# Patient Record
Sex: Female | Born: 1940 | Race: Black or African American | Hispanic: No | Marital: Single | Smoking: Never smoker
Health system: Southern US, Community
[De-identification: ages and names within clinical notes are randomized; demographics above are authoritative.]

## PROBLEM LIST (undated history)

## (undated) DIAGNOSIS — F0390 Unspecified dementia without behavioral disturbance: Secondary | ICD-10-CM

## (undated) DIAGNOSIS — I10 Essential (primary) hypertension: Secondary | ICD-10-CM

---

## 2018-09-02 ENCOUNTER — Emergency Department (HOSPITAL_BASED_OUTPATIENT_CLINIC_OR_DEPARTMENT_OTHER)
Admission: EM | Admit: 2018-09-02 | Discharge: 2018-09-02 | Disposition: A | Discharge status: Home / Self Care | Payer: Medicare Other | Attending: Emergency Medicine | Admitting: Emergency Medicine

## 2018-09-02 ENCOUNTER — Emergency Department (HOSPITAL_BASED_OUTPATIENT_CLINIC_OR_DEPARTMENT_OTHER): Payer: Medicare Other

## 2018-09-02 ENCOUNTER — Other Ambulatory Visit: Payer: Self-pay

## 2018-09-02 ENCOUNTER — Encounter (HOSPITAL_BASED_OUTPATIENT_CLINIC_OR_DEPARTMENT_OTHER): Payer: Self-pay | Admitting: *Deleted

## 2018-09-02 DIAGNOSIS — R002 Palpitations: Secondary | ICD-10-CM

## 2018-09-02 DIAGNOSIS — I10 Essential (primary) hypertension: Secondary | ICD-10-CM | POA: Insufficient documentation

## 2018-09-02 DIAGNOSIS — F0390 Unspecified dementia without behavioral disturbance: Secondary | ICD-10-CM | POA: Insufficient documentation

## 2018-09-02 DIAGNOSIS — Z79899 Other long term (current) drug therapy: Secondary | ICD-10-CM | POA: Insufficient documentation

## 2018-09-02 HISTORY — DX: Essential (primary) hypertension: I10

## 2018-09-02 HISTORY — DX: Unspecified dementia, unspecified severity, without behavioral disturbance, psychotic disturbance, mood disturbance, and anxiety: F03.90

## 2018-09-02 LAB — CBC WITH DIFFERENTIAL/PLATELET
Abs Immature Granulocytes: 0.01 10*3/uL (ref 0.00–0.07)
Basophils Absolute: 0 10*3/uL (ref 0.0–0.1)
Basophils Relative: 0 %
Eosinophils Absolute: 0.3 10*3/uL (ref 0.0–0.5)
Eosinophils Relative: 4 %
HCT: 31.8 % — ABNORMAL LOW (ref 36.0–46.0)
Hemoglobin: 9.5 g/dL — ABNORMAL LOW (ref 12.0–15.0)
Immature Granulocytes: 0 %
Lymphocytes Relative: 33 %
Lymphs Abs: 2.8 10*3/uL (ref 0.7–4.0)
MCH: 29 pg (ref 26.0–34.0)
MCHC: 29.9 g/dL — ABNORMAL LOW (ref 30.0–36.0)
MCV: 97 fL (ref 80.0–100.0)
Monocytes Absolute: 0.7 10*3/uL (ref 0.1–1.0)
Monocytes Relative: 8 %
Neutro Abs: 4.6 10*3/uL (ref 1.7–7.7)
Neutrophils Relative %: 55 %
Platelets: 210 10*3/uL (ref 150–400)
RBC: 3.28 MIL/uL — ABNORMAL LOW (ref 3.87–5.11)
RDW: 13.7 % (ref 11.5–15.5)
WBC: 8.4 10*3/uL (ref 4.0–10.5)
nRBC: 0 % (ref 0.0–0.2)

## 2018-09-02 LAB — URINALYSIS, ROUTINE W REFLEX MICROSCOPIC
Bilirubin Urine: NEGATIVE
Glucose, UA: NEGATIVE mg/dL
Hgb urine dipstick: NEGATIVE
Ketones, ur: NEGATIVE mg/dL
Nitrite: NEGATIVE
Protein, ur: NEGATIVE mg/dL
Specific Gravity, Urine: 1.01 (ref 1.005–1.030)
pH: 6 (ref 5.0–8.0)

## 2018-09-02 LAB — BASIC METABOLIC PANEL
Anion gap: 11 (ref 5–15)
BUN: 52 mg/dL — ABNORMAL HIGH (ref 8–23)
CO2: 23 mmol/L (ref 22–32)
Calcium: 9.4 mg/dL (ref 8.9–10.3)
Chloride: 104 mmol/L (ref 98–111)
Creatinine, Ser: 1.5 mg/dL — ABNORMAL HIGH (ref 0.44–1.00)
GFR calc Af Amer: 38 mL/min — ABNORMAL LOW (ref >60)
GFR calc non Af Amer: 33 mL/min — ABNORMAL LOW (ref >60)
Glucose, Bld: 85 mg/dL (ref 70–99)
Potassium: 4.6 mmol/L (ref 3.5–5.1)
Sodium: 138 mmol/L (ref 135–145)

## 2018-09-02 LAB — URINALYSIS, MICROSCOPIC (REFLEX)

## 2018-09-02 LAB — BRAIN NATRIURETIC PEPTIDE: B Natriuretic Peptide: 128.3 pg/mL — ABNORMAL HIGH (ref 0.0–100.0)

## 2018-09-02 NOTE — ED Triage Notes (Signed)
States she has had palpitations for 6 months. She flew from [place] and is  visiting her family. At night her palpitations are worse when she lays down. Her cardiologist wanted her to have an EKG before flying back to [place] in 3 days.

## 2018-09-02 NOTE — Discharge Instructions (Addendum)
Return here as needed.  Follow-up with your cardiologist as soon as possible.  Testing today was normal and your EKG did not show any abnormalities at this time.

## 2018-09-02 NOTE — ED Provider Notes (Signed)
[facility] EMERGENCY DEPARTMENT Provider Note   CSN: [id number] Arrival date & time: 6/[id number]/20  1257     History   Chief Complaint Chief Complaint  Patient presents with  . Palpitations    HPI Gina CommentDollie Palmer is a 78 y.o. female.     HPI Patient presents to the emergency department with palpitations that been ongoing over the last 6 months.  The patient states that she is visiting family from [place] and states last night she felt like her palpitations were worse.  She states that she spoke with her cardiologist who advised her to come to the emergency department for EKG.  Patient states that nothing seems to make the condition better or worse.  She states that lying down at night seems to make these palpitations worse.  The patient denies chest pain, shortness of breath, headache,blurred vision, neck pain, fever, cough, weakness, numbness, dizziness, anorexia, edema, abdominal pain, nausea, vomiting, diarrhea, rash, back pain, dysuria, hematemesis, bloody stool, near syncope, or syncope. Past Medical History:  Diagnosis Date  . Dementia (HCC)   . Hypertension     There are no active problems to display for this patient.   History reviewed. No pertinent surgical history.   OB History   No obstetric history on file.      Home Medications    Prior to Admission medications   Medication Sig Start Date End Date Taking? Authorizing Provider  ARIPiprazole (ABILIFY) 5 MG tablet Take 5 mg by mouth daily.   Yes [provider]  aspirin 81 MG chewable tablet Chew by mouth daily.   Yes [provider]  atorvastatin (LIPITOR) 80 MG tablet Take 80 mg by mouth daily.   Yes [provider]  furosemide (LASIX) 20 MG tablet Take 20 mg by mouth.   Yes [provider]  furosemide (LASIX) 40 MG tablet Take 40 mg by mouth.   Yes [provider]  isosorbide mononitrate (IMDUR) 60 MG 24 hr tablet Take 60 mg by mouth daily.   Yes [provider]  metoprolol succinate (TOPROL-XL) 50 MG 24 hr tablet Take 50 mg by mouth daily. Take with or immediately following a meal.   Yes [provider]  NIFEdipine (ADALAT CC) 60 MG 24 hr tablet Take 60 mg by mouth daily.   Yes [provider]  sertraline (ZOLOFT) 50 MG tablet Take 50 mg by mouth daily.   Yes [provider]  traZODone (DESYREL) 50 MG tablet Take 50 mg by mouth at bedtime.   Yes [provider]  valsartan (DIOVAN) 320 MG tablet Take 320 mg by mouth daily.   Yes [provider]    Family History No family history on file.  Social History Social History   Tobacco Use  . Smoking status: Never Smoker  . Smokeless tobacco: Never Used  Substance Use Topics  . Alcohol use: Never    Frequency: Never  . Drug use: Never     Allergies   Patient has no known allergies.   Review of Systems Review of Systems All other systems negative except as documented in the HPI. All pertinent positives and negatives as reviewed in the HPI.  Physical Exam Updated Vital Signs BP (!) 151/58 (BP Location: Right Arm)   Pulse 67   Temp 98.3 F (36.8 C) (Oral)   Resp 14   Ht 5\' 1"  (1.549 m)   Wt 76.7 kg   SpO2 98%   BMI 31.93 kg/m   Physical  Exam Vitals signs and nursing note reviewed.  Constitutional:      General: She is not in acute distress.    Appearance: She is well-developed.  HENT:     Head: Normocephalic and atraumatic.  Eyes:     Pupils: Pupils are equal, round, and reactive to light.  Neck:     Musculoskeletal: Normal range of motion and neck supple.  Cardiovascular:     Rate and Rhythm: Normal rate and regular rhythm.     Heart sounds: Normal heart sounds. No murmur. No friction rub. No gallop.   Pulmonary:     Effort: Pulmonary effort is normal. No respiratory distress.     Breath sounds: Normal breath sounds. No wheezing.  Abdominal:     General: Bowel sounds are normal. There is no distension.      Palpations: Abdomen is soft.     Tenderness: There is no abdominal tenderness.  Skin:    General: Skin is warm and dry.     Capillary Refill: Capillary refill takes less than 2 seconds.     Findings: No erythema or rash.  Neurological:     Mental Status: She is alert and oriented to person, place, and time.     Motor: No abnormal muscle tone.     Coordination: Coordination normal.  Psychiatric:        Behavior: Behavior normal.      ED Treatments / Results  Labs (all labs ordered are listed, but only abnormal results are displayed) Labs Reviewed  BASIC METABOLIC PANEL - Abnormal; Notable for the following components:      Result Value   BUN 52 (*)    Creatinine, Ser 1.50 (*)    GFR calc non Af Amer 33 (*)    GFR calc Af Amer 38 (*)    All other components within normal limits  CBC WITH DIFFERENTIAL/PLATELET - Abnormal; Notable for the following components:   RBC 3.28 (*)    Hemoglobin 9.5 (*)    HCT 31.8 (*)    MCHC 29.9 (*)    All other components within normal limits  URINALYSIS, ROUTINE W REFLEX MICROSCOPIC - Abnormal; Notable for the following components:   Leukocytes,Ua TRACE (*)    All other components within normal limits  BRAIN NATRIURETIC PEPTIDE - Abnormal; Notable for the following components:   B Natriuretic Peptide 128.3 (*)    All other components within normal limits  URINALYSIS, MICROSCOPIC (REFLEX) - Abnormal; Notable for the following components:   Bacteria, UA FEW (*)    All other components within normal limits    EKG EKG Interpretation  Date/Time:  Monday September 02 2018 13:12:00 EDT Ventricular Rate:  63 PR Interval:  190 QRS Duration: 82 QT Interval:  396 QTC Calculation: 405 R Axis:   -2 Text Interpretation:  Normal sinus rhythm Normal ECG Confirmed by Jacalyn LefevreHaviland, Julie [phone]([id number]) on 09/02/2018 1:42:20 PM   Radiology Dg Chest 2 View  Result Date: 09/02/2018 CLINICAL DATA:  Shortness of breath and recent travel EXAM: CHEST - 2 VIEW COMPARISON:   None. FINDINGS: Cardiac shadows within normal limits. Aortic calcifications are noted. The lungs are well aerated without focal infiltrate or sizable effusion. No bony abnormality is noted. IMPRESSION: No active cardiopulmonary disease. Electronically Signed   By: Alcide CleverMark  Lukens M.D.   On: 09/02/2018 14:57    Procedures Procedures (including critical care time)  Medications Ordered in ED Medications - No data to display   Initial Impression / Assessment and Plan / ED  Course  I have reviewed the triage vital signs and the nursing notes.  Pertinent labs & imaging results that were available during my care of the patient were reviewed by me and considered in my medical decision making (see chart for details).        The patient will be discharged home.  Her laboratory testing does not yield any significant abnormalities.  EKG does not show any acute changes.  The patient is advised to follow-up with her cardiologist when she returns home.  Told to return here as needed.  Patient agrees the plan and all questions were answered.  Final Clinical Impressions(s) / ED Diagnoses   Final diagnoses:  None    ED Discharge Orders    None       Dalia Heading, PA-C 09/02/18 [place]    Isla Pence, MD 09/04/18 1431

## 2019-11-16 IMAGING — DX CHEST - 2 VIEW
2 series · 2 of 2 positions shown · non-contrast
Comparison: None.

CLINICAL DATA: Shortness of breath and recent travel

EXAM:
CHEST - 2 VIEW

[chest pa]
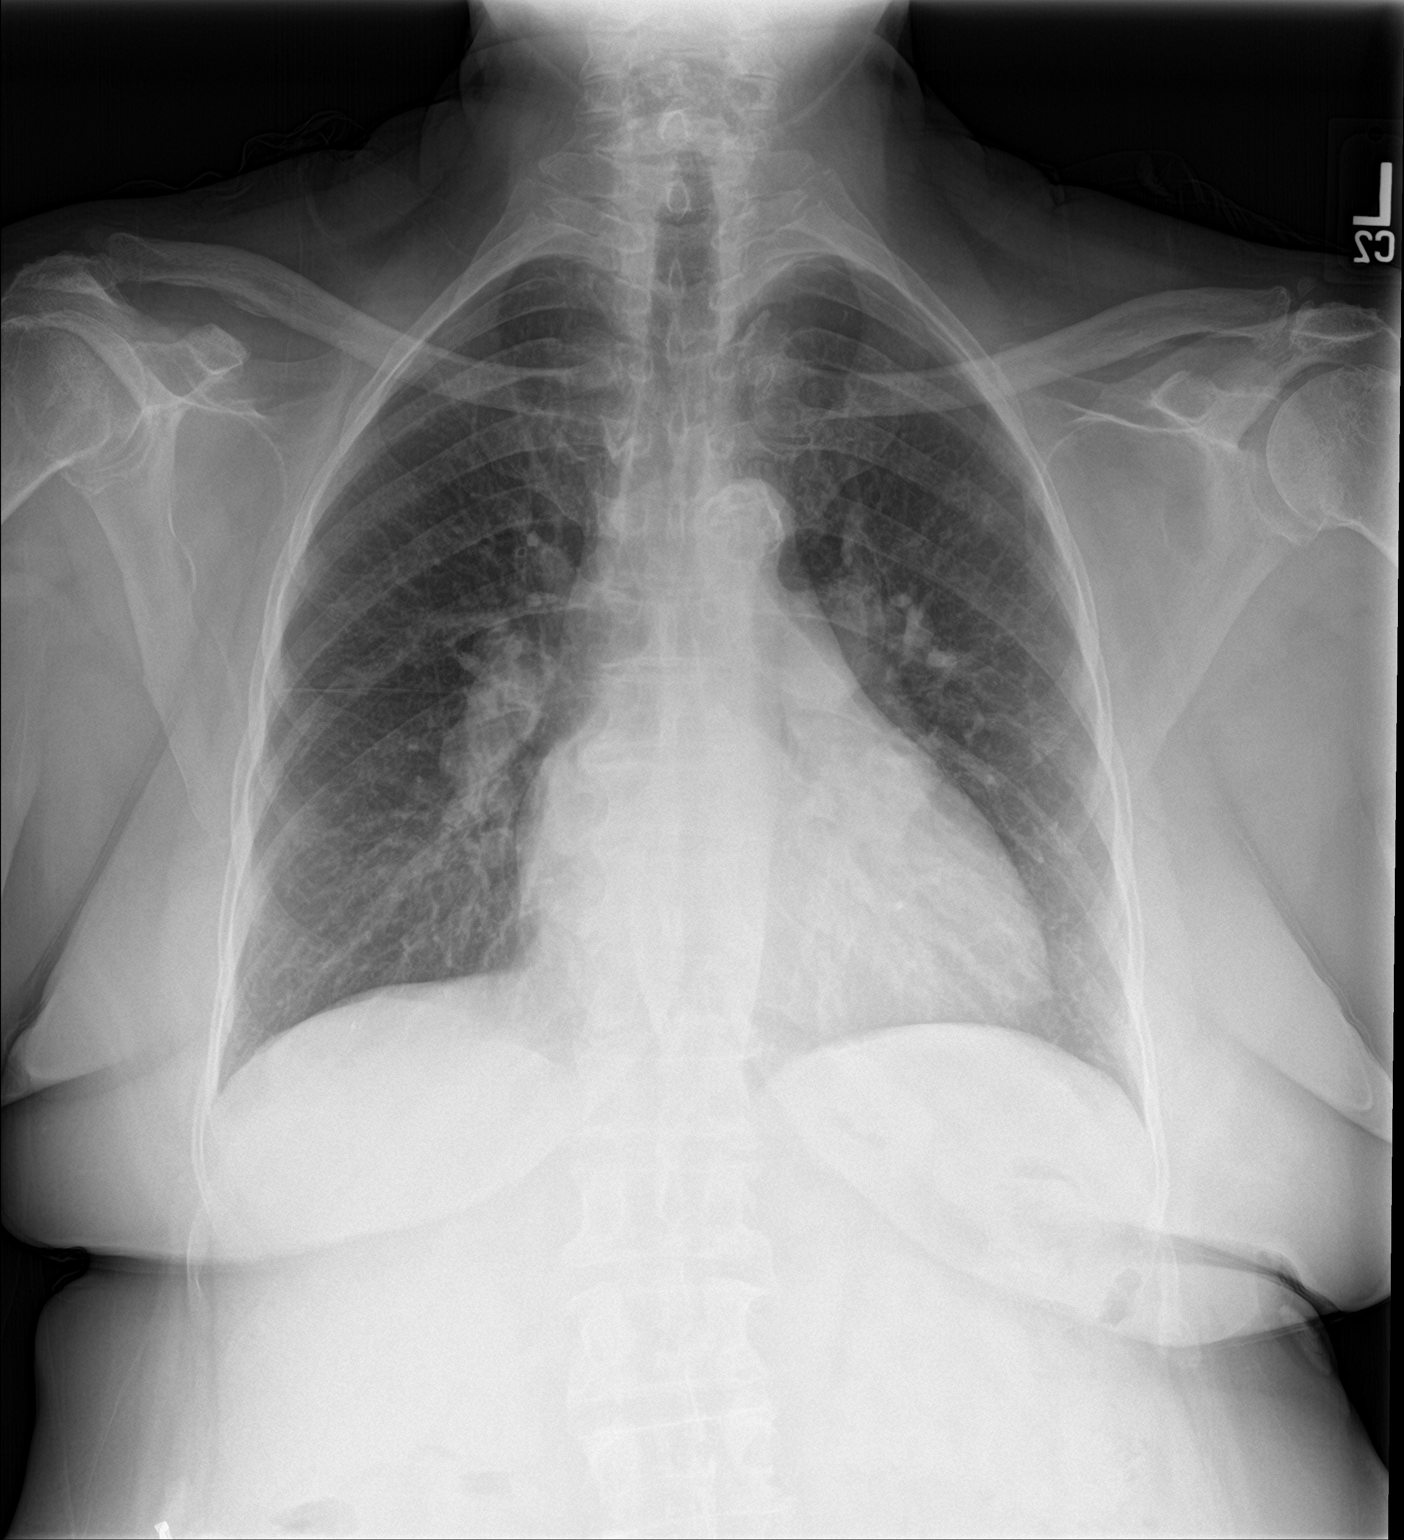

[chest lat]
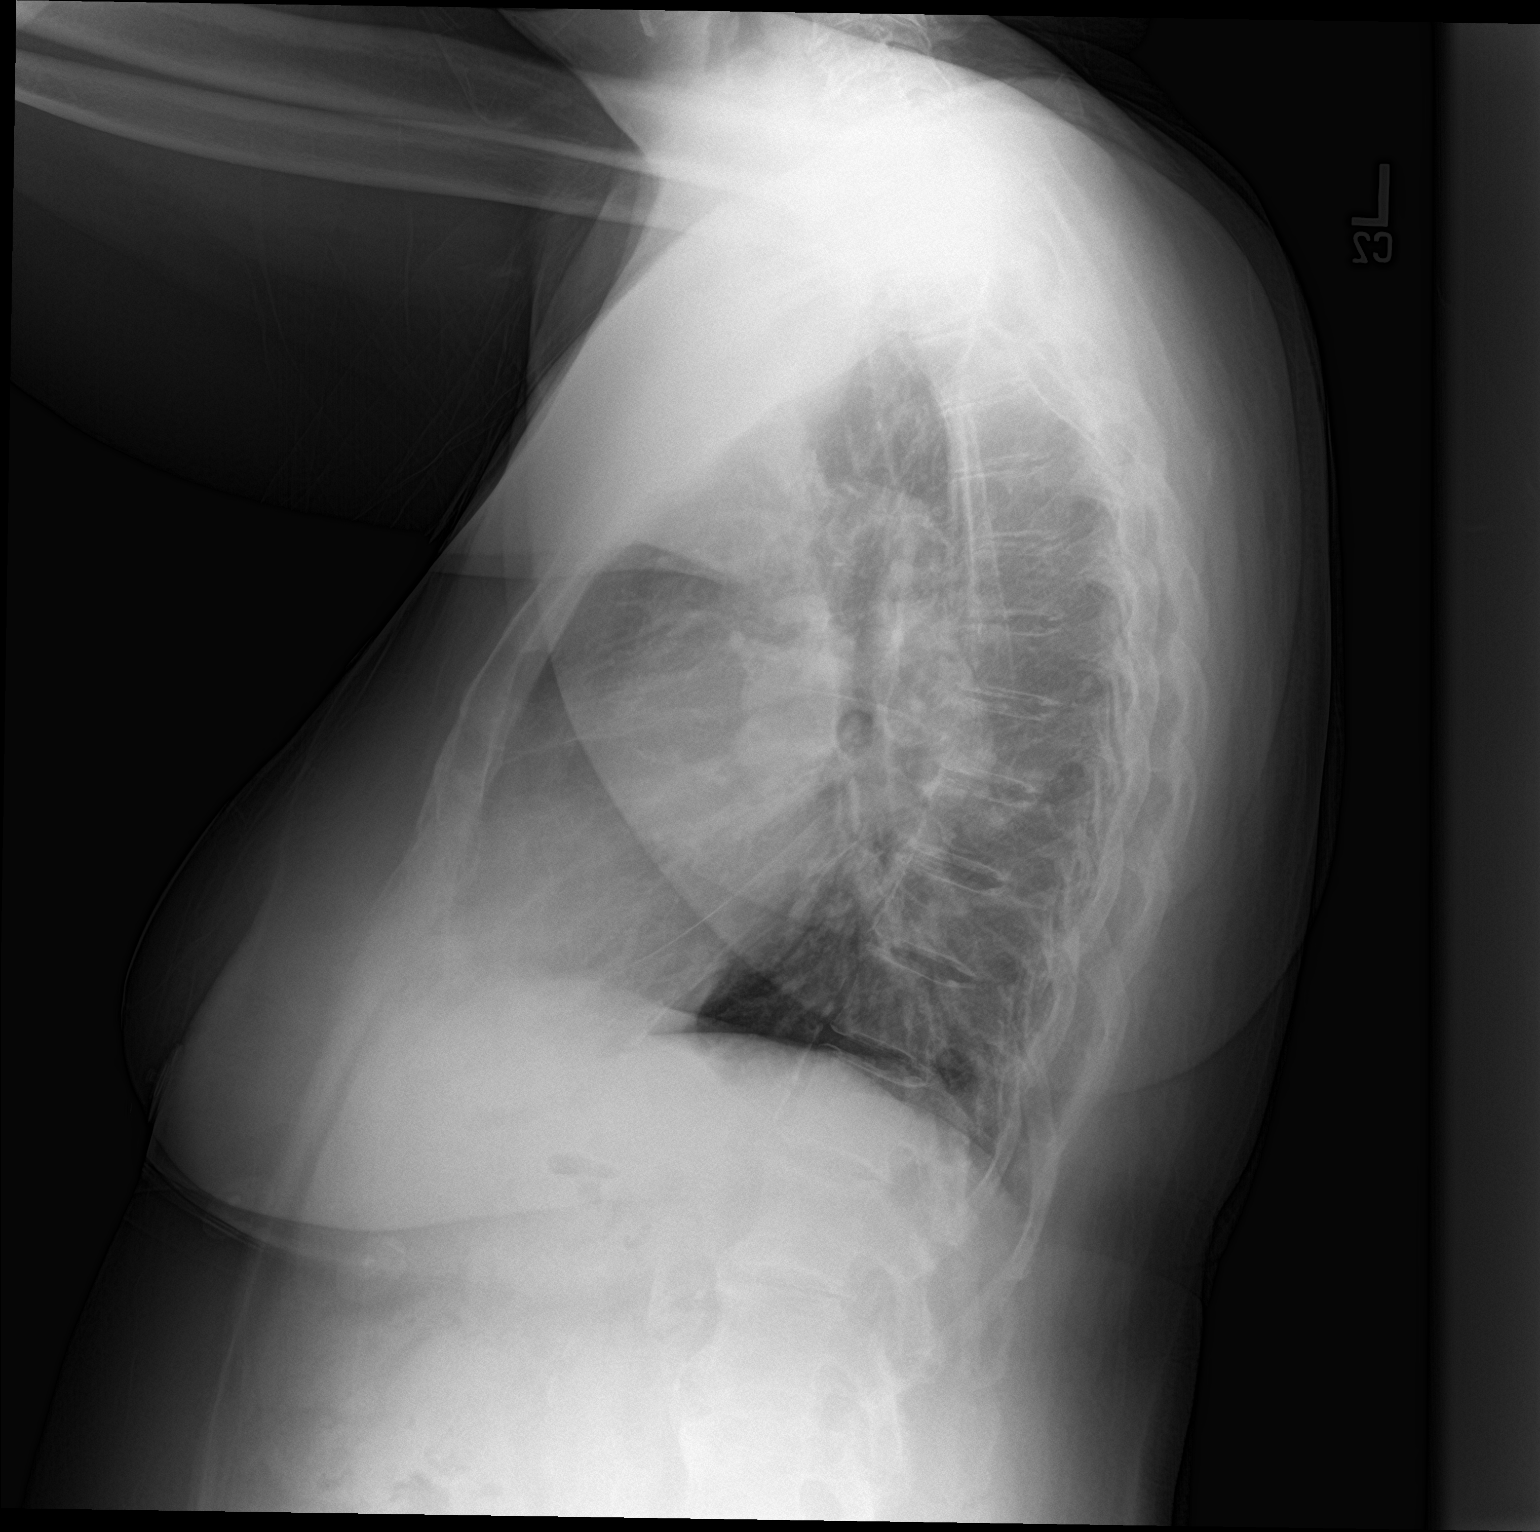

[2 of 2 positions shown; findings below may reference images not displayed]

FINDINGS: Cardiac shadows within normal limits. Aortic calcifications are
noted. The lungs are well aerated without focal infiltrate or
sizable effusion. No bony abnormality is noted.
IMPRESSION: No active cardiopulmonary disease.
